# Patient Record
Sex: Male | Born: 2000 | Race: Black or African American | Hispanic: No | Marital: Single
Health system: Southern US, Community
[De-identification: ages and names within clinical notes are randomized; demographics above are authoritative.]

## PROBLEM LIST (undated history)

## (undated) DIAGNOSIS — L309 Dermatitis, unspecified: Secondary | ICD-10-CM

## (undated) HISTORY — DX: Dermatitis, unspecified: L30.9

---
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
## 2020-04-27 ENCOUNTER — Ambulatory Visit: Payer: Self-pay | Attending: Internal Medicine

## 2020-04-27 DIAGNOSIS — Z23 Encounter for immunization: Secondary | ICD-10-CM

## 2020-04-27 NOTE — Progress Notes (Signed)
   Covid-19 Vaccination Clinic  Name:  Craig Kelly    MRN: [id number] DOB: November 01, 2001  04/27/2020  [name] was observed post Covid-19 immunization for 15 minutes without incident. He was provided with Vaccine Information Sheet and instruction to access the V-Safe system.   [name] was instructed to call 911 with any severe reactions post vaccine: Marland Kitchen Difficulty breathing  . Swelling of face and throat  . A fast heartbeat  . A bad rash all over body  . Dizziness and weakness   Immunizations Administered    Name Date Dose VIS Date Route   Pfizer COVID-19 Vaccine 04/27/2020  3:13 PM 0.3 mL 01/14/2019 Intramuscular   Manufacturer: [employer], [employer]   Lot: [id number]   NDC: [address]-4

## 2020-05-20 ENCOUNTER — Ambulatory Visit: Payer: Self-pay | Attending: Internal Medicine

## 2020-05-20 DIAGNOSIS — Z23 Encounter for immunization: Secondary | ICD-10-CM

## 2020-05-20 NOTE — Progress Notes (Signed)
   Covid-19 Vaccination Clinic  Name:  Davison Ohms    MRN: [id number] DOB: 2001/04/29  05/20/2020  Mr. Khader was observed post Covid-19 immunization for 15 minutes without incident. He was provided with Vaccine Information Sheet and instruction to access the V-Safe system.   Mr. Schewe was instructed to call 911 with any severe reactions post vaccine: Marland Kitchen Difficulty breathing  . Swelling of face and throat  . A fast heartbeat  . A bad rash all over body  . Dizziness and weakness   Immunizations Administered    Name Date Dose VIS Date Route   Pfizer COVID-19 Vaccine 05/20/2020  1:54 PM 0.3 mL 01/14/2019 Intramuscular   Manufacturer: [employer], [employer]   Lot: [id number]   NDC: [address]-7

## 2020-11-23 ENCOUNTER — Ambulatory Visit: Payer: Self-pay | Attending: Internal Medicine

## 2020-11-23 DIAGNOSIS — Z23 Encounter for immunization: Secondary | ICD-10-CM

## 2020-11-23 NOTE — Progress Notes (Unsigned)
   Covid-19 Vaccination Clinic  Name:  Craig Kelly    MRN: [id number] DOB: 2001-10-25  11/23/2020  [name] was observed post Covid-19 immunization for 15 minutes without incident. He was provided with Vaccine Information Sheet and instruction to access the V-Safe system.   [name] was instructed to call 911 with any severe reactions post vaccine: Marland Kitchen Difficulty breathing  . Swelling of face and throat  . A fast heartbeat  . A bad rash all over body  . Dizziness and weakness   Immunizations Administered    Name Date Dose VIS Date Route   Pfizer COVID-19 Vaccine 11/23/2020  5:29 PM 0.3 mL 09/08/2020 Intramuscular   Manufacturer: [employer], [employer]   Lot: [id number]   NDC: [address]-3

## 2020-11-23 NOTE — Progress Notes (Unsigned)
   Covid-19 Vaccination Clinic  Name:  Craig Kelly    MRN: [id number] DOB: 2001/05/27  11/23/2020  Mr. Craig Kelly was observed post Covid-19 immunization for {COVID Vaccine Observation Times:23551} without incident. He was provided with Vaccine Information Sheet and instruction to access the V-Safe system.   Mr. Craig Kelly was instructed to call 911 with any severe reactions post vaccine: Marland Kitchen Difficulty breathing  . Swelling of face and throat  . A fast heartbeat  . A bad rash all over body  . Dizziness and weakness    *** Covid vaccine administration is NOT RECORDED.  Must document administration and refresh note before signing ***
